# Patient Record
Sex: Female | Born: 1935 | Race: Black or African American | Hispanic: No | Marital: Single
Health system: Southern US, Community
[De-identification: ages and names within clinical notes are randomized; demographics above are authoritative.]

## PROBLEM LIST (undated history)

## (undated) DIAGNOSIS — S32000A Wedge compression fracture of unspecified lumbar vertebra, initial encounter for closed fracture: Secondary | ICD-10-CM

## (undated) DIAGNOSIS — C801 Malignant (primary) neoplasm, unspecified: Secondary | ICD-10-CM

## (undated) DIAGNOSIS — G939 Disorder of brain, unspecified: Secondary | ICD-10-CM

## (undated) DIAGNOSIS — E119 Type 2 diabetes mellitus without complications: Secondary | ICD-10-CM

## (undated) DIAGNOSIS — I10 Essential (primary) hypertension: Secondary | ICD-10-CM

## (undated) DIAGNOSIS — D649 Anemia, unspecified: Secondary | ICD-10-CM

---

## 2010-12-06 ENCOUNTER — Other Ambulatory Visit: Payer: Self-pay | Admitting: Oncology

## 2010-12-06 ENCOUNTER — Encounter (HOSPITAL_BASED_OUTPATIENT_CLINIC_OR_DEPARTMENT_OTHER): Payer: Medicare Other | Admitting: Oncology

## 2010-12-06 DIAGNOSIS — C499 Malignant neoplasm of connective and soft tissue, unspecified: Secondary | ICD-10-CM

## 2010-12-06 DIAGNOSIS — I10 Essential (primary) hypertension: Secondary | ICD-10-CM

## 2010-12-06 DIAGNOSIS — C549 Malignant neoplasm of corpus uteri, unspecified: Secondary | ICD-10-CM

## 2010-12-06 DIAGNOSIS — E119 Type 2 diabetes mellitus without complications: Secondary | ICD-10-CM

## 2010-12-06 LAB — COMPREHENSIVE METABOLIC PANEL
Albumin: 4.1 g/dL (ref 3.5–5.2)
Alkaline Phosphatase: 66 U/L (ref 39–117)
BUN: 25 mg/dL — ABNORMAL HIGH (ref 6–23)
Creatinine, Ser: 0.98 mg/dL (ref 0.50–1.10)
Glucose, Bld: 91 mg/dL (ref 70–99)
Potassium: 4.4 mEq/L (ref 3.5–5.3)

## 2010-12-06 LAB — CBC WITH DIFFERENTIAL/PLATELET
Basophils Absolute: 0 10*3/uL (ref 0.0–0.1)
Eosinophils Absolute: 0.3 10*3/uL (ref 0.0–0.5)
HCT: 37.8 % (ref 34.8–46.6)
HGB: 12.8 g/dL (ref 11.6–15.9)
LYMPH%: 39.9 % (ref 14.0–49.7)
MCH: 39.3 pg — ABNORMAL HIGH (ref 25.1–34.0)
MCV: 115.9 fL — ABNORMAL HIGH (ref 79.5–101.0)
MONO%: 8.2 % (ref 0.0–14.0)
NEUT#: 3.4 10*3/uL (ref 1.5–6.5)
NEUT%: 47.6 % (ref 38.4–76.8)
Platelets: 246 10*3/uL (ref 145–400)
RDW: 14.5 % (ref 11.2–14.5)

## 2010-12-14 ENCOUNTER — Other Ambulatory Visit (HOSPITAL_COMMUNITY): Payer: Medicare Other

## 2015-03-02 ENCOUNTER — Emergency Department (HOSPITAL_COMMUNITY): Payer: Medicare Other

## 2015-03-02 ENCOUNTER — Emergency Department (HOSPITAL_COMMUNITY)
Admission: EM | Admit: 2015-03-02 | Discharge: 2015-03-02 | Disposition: A | Discharge status: Home / Self Care | Payer: Medicare Other | Attending: Emergency Medicine | Admitting: Emergency Medicine

## 2015-03-02 ENCOUNTER — Encounter (HOSPITAL_COMMUNITY): Payer: Self-pay

## 2015-03-02 DIAGNOSIS — K5900 Constipation, unspecified: Secondary | ICD-10-CM | POA: Diagnosis not present

## 2015-03-02 DIAGNOSIS — Z8669 Personal history of other diseases of the nervous system and sense organs: Secondary | ICD-10-CM | POA: Insufficient documentation

## 2015-03-02 DIAGNOSIS — Z862 Personal history of diseases of the blood and blood-forming organs and certain disorders involving the immune mechanism: Secondary | ICD-10-CM | POA: Insufficient documentation

## 2015-03-02 DIAGNOSIS — I10 Essential (primary) hypertension: Secondary | ICD-10-CM | POA: Diagnosis not present

## 2015-03-02 DIAGNOSIS — Z79899 Other long term (current) drug therapy: Secondary | ICD-10-CM | POA: Insufficient documentation

## 2015-03-02 DIAGNOSIS — E119 Type 2 diabetes mellitus without complications: Secondary | ICD-10-CM | POA: Insufficient documentation

## 2015-03-02 DIAGNOSIS — Z8781 Personal history of (healed) traumatic fracture: Secondary | ICD-10-CM | POA: Insufficient documentation

## 2015-03-02 DIAGNOSIS — C499 Malignant neoplasm of connective and soft tissue, unspecified: Secondary | ICD-10-CM | POA: Insufficient documentation

## 2015-03-02 DIAGNOSIS — Z87440 Personal history of urinary (tract) infections: Secondary | ICD-10-CM | POA: Insufficient documentation

## 2015-03-02 DIAGNOSIS — C7800 Secondary malignant neoplasm of unspecified lung: Secondary | ICD-10-CM | POA: Insufficient documentation

## 2015-03-02 DIAGNOSIS — C480 Malignant neoplasm of retroperitoneum: Secondary | ICD-10-CM | POA: Insufficient documentation

## 2015-03-02 DIAGNOSIS — R197 Diarrhea, unspecified: Secondary | ICD-10-CM | POA: Diagnosis present

## 2015-03-02 HISTORY — DX: Malignant (primary) neoplasm, unspecified: C80.1

## 2015-03-02 HISTORY — DX: Anemia, unspecified: D64.9

## 2015-03-02 HISTORY — DX: Essential (primary) hypertension: I10

## 2015-03-02 HISTORY — DX: Disorder of brain, unspecified: G93.9

## 2015-03-02 HISTORY — DX: Type 2 diabetes mellitus without complications: E11.9

## 2015-03-02 HISTORY — DX: Wedge compression fracture of unspecified lumbar vertebra, initial encounter for closed fracture: S32.000A

## 2015-03-02 LAB — COMPREHENSIVE METABOLIC PANEL
ALT: 14 U/L (ref 14–54)
ANION GAP: 8 (ref 5–15)
AST: 24 U/L (ref 15–41)
Albumin: 2.9 g/dL — ABNORMAL LOW (ref 3.5–5.0)
Alkaline Phosphatase: 76 U/L (ref 38–126)
BILIRUBIN TOTAL: 1.1 mg/dL (ref 0.3–1.2)
BUN: 39 mg/dL — AB (ref 6–20)
CHLORIDE: 106 mmol/L (ref 101–111)
CO2: 26 mmol/L (ref 22–32)
Calcium: 9.5 mg/dL (ref 8.9–10.3)
Creatinine, Ser: 1.14 mg/dL — ABNORMAL HIGH (ref 0.44–1.00)
GFR calc Af Amer: 50 mL/min — ABNORMAL LOW (ref >60)
GFR, EST NON AFRICAN AMERICAN: 43 mL/min — AB (ref >60)
Glucose, Bld: 104 mg/dL — ABNORMAL HIGH (ref 65–99)
POTASSIUM: 3.7 mmol/L (ref 3.5–5.1)
Sodium: 140 mmol/L (ref 135–145)
TOTAL PROTEIN: 7.1 g/dL (ref 6.5–8.1)

## 2015-03-02 LAB — CBC WITH DIFFERENTIAL/PLATELET
Basophils Absolute: 0.1 10*3/uL (ref 0.0–0.1)
Basophils Relative: 1 %
EOS PCT: 2 %
Eosinophils Absolute: 0.1 10*3/uL (ref 0.0–0.7)
HCT: 33.1 % — ABNORMAL LOW (ref 36.0–46.0)
HEMOGLOBIN: 11.1 g/dL — AB (ref 12.0–15.0)
LYMPHS ABS: 1.4 10*3/uL (ref 0.7–4.0)
LYMPHS PCT: 25 %
MCH: 35.9 pg — AB (ref 26.0–34.0)
MCHC: 33.5 g/dL (ref 30.0–36.0)
MCV: 107.1 fL — AB (ref 78.0–100.0)
MONOS PCT: 22 %
Monocytes Absolute: 1.2 10*3/uL — ABNORMAL HIGH (ref 0.1–1.0)
NEUTROS PCT: 50 %
Neutro Abs: 2.9 10*3/uL (ref 1.7–7.7)
Platelets: 351 10*3/uL (ref 150–400)
RBC: 3.09 MIL/uL — AB (ref 3.87–5.11)
RDW: 18.2 % — ABNORMAL HIGH (ref 11.5–15.5)
WBC: 5.7 10*3/uL (ref 4.0–10.5)

## 2015-03-02 LAB — I-STAT CHEM 8, ED
BUN: 36 mg/dL — AB (ref 6–20)
CALCIUM ION: 1.25 mmol/L (ref 1.13–1.30)
CHLORIDE: 104 mmol/L (ref 101–111)
Creatinine, Ser: 1 mg/dL (ref 0.44–1.00)
GLUCOSE: 99 mg/dL (ref 65–99)
HCT: 29 % — ABNORMAL LOW (ref 36.0–46.0)
Hemoglobin: 9.9 g/dL — ABNORMAL LOW (ref 12.0–15.0)
Potassium: 3.6 mmol/L (ref 3.5–5.1)
Sodium: 141 mmol/L (ref 135–145)
TCO2: 27 mmol/L (ref 0–100)

## 2015-03-02 MED ORDER — ALIGN PO CAPS: 1.0000 | ORAL_CAPSULE | Freq: Three times a day (TID) | ORAL | Status: AC

## 2015-03-02 MED ORDER — HEPARIN SOD (PORK) LOCK FLUSH 100 UNIT/ML IV SOLN
500.0000 [IU] | Freq: Once | INTRAVENOUS | Status: AC
  Administered 2015-03-02: 500 [IU]
  Filled 2015-03-02: qty 5

## 2015-03-02 MED ORDER — SENNOSIDES-DOCUSATE SODIUM 8.6-50 MG PO TABS: 1.0000 | ORAL_TABLET | Freq: Two times a day (BID) | ORAL | Status: AC

## 2015-03-02 MED ORDER — DOCUSATE SODIUM 100 MG PO CAPS
100.0000 mg | ORAL_CAPSULE | Freq: Once | ORAL | Status: AC
  Administered 2015-03-02: 100 mg via ORAL
  Filled 2015-03-02: qty 1

## 2015-03-02 MED ORDER — GLYCERIN (LAXATIVE) 2.1 G RE SUPP
1.0000 | Freq: Once | RECTAL | Status: AC
  Administered 2015-03-02: 1 via RECTAL
  Filled 2015-03-02: qty 1

## 2015-03-02 MED ORDER — POLYETHYLENE GLYCOL 3350 17 GM/SCOOP PO POWD: 17.0000 g | Freq: Once | ORAL | Status: AC

## 2015-03-02 NOTE — ED Notes (Signed)
Bed: WA25 Expected date:  Expected time:  Means of arrival:  Comments: 

## 2015-03-02 NOTE — ED Notes (Signed)
Pt complaining of diarrhea. Denies N/V/F. Pt has had diarrhea 2x so far this morning. Pt also reports decreased appetite. Last treatment was Oct 4. A and O x 3. Daughter reports that is her baseline. Denies pain.

## 2015-03-02 NOTE — Discharge Instructions (Signed)

## 2015-03-02 NOTE — ED Notes (Signed)
Pt requests for only daughter to assist with putting on briefs.

## 2015-03-02 NOTE — ED Provider Notes (Signed)
CSN: [id number]     Arrival date & time 03/02/15  0132 History  By signing my name below, I, Angela Rodriguez, attest that this documentation has been prepared under the direction and in the presence of Angela Zwiefelhofer, MD. Electronically Signed: Helane Rodriguez, ED Scribe. 03/02/2015. 3:49 AM.    Chief Complaint  Patient presents with  . Diarrhea  . Cancer   Patient is a 81 y.o. female presenting with diarrhea. The history is provided by the patient and a relative. No language interpreter was used.  Diarrhea Quality:  Watery (following dulcolax) Severity:  Moderate Onset quality:  Sudden Duration:  2 days Timing:  Sporadic Progression:  Worsening Relieved by:  Nothing Worsened by:  Nothing tried Ineffective treatments:  None tried Associated symptoms: no abdominal pain, no fever and no vomiting   Risk factors: no recent antibiotic use and no sick contacts    HPI Comments: Angela Rodriguez is a 81 y.o. female with a PMHx of stage 4 cancer, DM, HTN, anemia, brain disorder and a compression fracture of the lumbar vertebra who presents to the Emergency Department complaining of worsening cancer symptoms and diarrhea (2x) onset 2 days ago. Daughter states pt took an OTC dulcolax 3 days ago for constipation. She states pt had several BM's every few hours after taking the medication, which grew progressively more watery. She also reports pt has not been eating and drinking well, and may be dehydrated. She notes pt's last cancer treatment was on 10/4 at [facility]. Per daughter pt is not appropriate for chemotherapy at this time due to lost weight and loss of appetite. She notes pt was on chemo pills last year, then went to  IV infusions this year. She notes pt was amditted at [facility] for confusion on 11/1, where she was diagnosed with a UTI and given oral antibiotics to be taken 3x daily (last dose taken on 11/10).  Daughter denies pt having any recent sick contacts. Pt denies n/v, and fever.   Pt is  allergic to tramadol.  Past Medical History  Diagnosis Date  . Cancer (HCC)     leiomysarcoma, retroperitoneal sarcoma,secondary cancer of lung, cancer of connective and soft tissue  . Diabetes mellitus without complication ([place])     type 2  . Compression fracture of lumbar vertebra ([place])   . Hypertension   . Anemia   . Brain disorder    No past surgical history on file. No family history on file. Social History  Substance Use Topics  . Smoking status: None  . Smokeless tobacco: None  . Alcohol Use: None   OB History    No data available     Review of Systems  Constitutional: Negative for fever.  Gastrointestinal: Positive for diarrhea. Negative for nausea, vomiting and abdominal pain.  All other systems reviewed and are negative.   Allergies  Tramadol  Home Medications   Prior to Admission medications   Medication Sig Start Date End Date Taking? Authorizing Provider  amLODipine (NORVASC) 5 MG tablet Take 5 mg by mouth daily.   Yes Historical Provider, MD  ascorbic acid (VITAMIN C) 500 MG tablet Take 500 mg by mouth daily.   Yes Historical Provider, MD  atenolol (TENORMIN) 50 MG tablet Take 50 mg by mouth daily. 12/05/14  Yes Historical Provider, MD  atorvastatin (LIPITOR) 10 MG tablet Take 10 mg by mouth daily. 05/15/12  Yes Historical Provider, MD  Calcium Carbonate-Vitamin D 600-400 MG-UNIT tablet Take 1 tablet by mouth daily.  Yes Historical Provider, MD  docusate sodium (COLACE) 100 MG capsule Take 100 mg by mouth daily as needed. For constipation   Yes Historical Provider, MD  glipiZIDE (GLUCOTROL XL) 2.5 MG 24 hr tablet Take 2.5 mg by mouth daily as needed. For high blood sugar   Yes Historical Provider, MD  hydrochlorothiazide (HYDRODIURIL) 25 MG tablet Take 25 mg by mouth daily.   Yes Historical Provider, MD  lisinopril (PRINIVIL,ZESTRIL) 10 MG tablet Take 10 mg by mouth daily. 07/08/14 07/08/15 Yes Historical Provider, MD  magic mouthwash SOLN Take 10 mLs by  mouth 4 (four) times daily as needed. For oral pain 06/23/14  Yes Historical Provider, MD  Multiple Vitamin (MULTIVITAMIN WITH MINERALS) TABS tablet Take 1 tablet by mouth daily.   Yes Historical Provider, MD  Omega-3 Fatty Acids (FISH OIL) 1000 MG CAPS Take 1,000 mg by mouth daily.   Yes Historical Provider, MD  potassium chloride (K-DUR) 10 MEQ tablet Take 10 mEq by mouth daily. 05/29/14  Yes Historical Provider, MD  prochlorperazine (COMPAZINE) 10 MG tablet Take 10 mg by mouth every 6 (six) hours as needed. For nausea associated with chemo 04/15/14  Yes Historical Provider, MD  mirtazapine (REMERON SOL-TAB) 15 MG disintegrating tablet Take 15 mg by mouth at bedtime as needed (for sleep).  02/25/15   Historical Provider, MD   BP 136/81 mmHg  Pulse 91  Temp(Src) 97.9 F (36.6 C) (Oral)  Resp 18  SpO2 95% Physical Exam  Constitutional: She is oriented to person, place, and time. She appears well-developed and well-nourished.  HENT:  Head: Normocephalic and atraumatic.  Mouth/Throat: Oropharynx is clear and moist.  Eyes: Conjunctivae and EOM are normal. Pupils are equal, round, and reactive to light. Right eye exhibits no discharge. Left eye exhibits no discharge. No scleral icterus.  Neck: Normal range of motion. Neck supple.  Cardiovascular: Normal rate, regular rhythm and normal heart sounds.   Pulmonary/Chest: Effort normal and breath sounds normal. No respiratory distress. She has no wheezes. She has no rales.  Abdominal: Soft. Bowel sounds are normal. She exhibits no distension. There is no tenderness. There is no rebound and no guarding.  Stool palpated  Musculoskeletal: Normal range of motion. She exhibits no edema or tenderness.  Neurological: She is alert and oriented to person, place, and time. She has normal reflexes. Coordination normal.  Skin: Skin is warm and dry. No rash noted. She is not diaphoretic. No erythema.  Psychiatric: She has a normal mood and affect.  Nursing note and  vitals reviewed.   ED Course  Procedures  DIAGNOSTIC STUDIES: Oxygen Saturation is 95% on RA, adequate by my interpretation.    COORDINATION OF CARE: 3:33 AM - Discussed XR results of constipation. Discussed plans to wait on diagnostic studies. Pt advised of plan for treatment and pt agrees.  Labs Review Labs Reviewed  CBC WITH DIFFERENTIAL/PLATELET - Abnormal; Notable for the following:    RBC 3.09 (*)    Hemoglobin 11.1 (*)    HCT 33.1 (*)    MCV 107.1 (*)    MCH 35.9 (*)    RDW 18.2 (*)    Monocytes Absolute 1.2 (*)    All other components within normal limits  I-STAT CHEM 8, ED - Abnormal; Notable for the following:    BUN 36 (*)    Hemoglobin 9.9 (*)    HCT 29.0 (*)    All other components within normal limits  COMPREHENSIVE METABOLIC PANEL  URINALYSIS, ROUTINE W REFLEX MICROSCOPIC (NOT AT  [place])    Imaging Review Dg Abd Acute W/chest  03/02/2015  CLINICAL DATA:  Acute onset of diarrhea for 3 days. Initial encounter. EXAM: DG ABDOMEN ACUTE W/ 1V CHEST COMPARISON:  None. FINDINGS: The lungs are well-aerated. There appears to be a 2.4 cm nodule at the right midlung zone. No pleural effusion or pneumothorax is seen. A 2.3 cm pleural-based nodule is suggested at the left lung base. The cardiomediastinal silhouette is normal in size. A large calcified node is noted above the left hilum. A calcified granuloma is noted at the left midlung zone. A left-sided chest port is noted ending about the mid SVC. The visualized bowel gas pattern is unremarkable. Scattered stool and air are seen within the colon; there is no evidence of small bowel dilatation to suggest obstruction. No free intra-abdominal air is identified on the provided upright view. Clips are noted within the right upper quadrant, reflecting prior cholecystectomy. No acute osseous abnormalities are seen; the sacroiliac joints are unremarkable in appearance. Degenerative change is noted along the lumbar spine, with apparent  chronic loss of height at vertebral body L1. IMPRESSION: 1. Apparent 2.4 cm nodule at the right midlung zone, and 2.3 cm pleural-based nodule at the left lung base. CT of the chest would be helpful for further evaluation, when and as deemed clinically appropriate. 2. Unremarkable bowel gas pattern; no free intra-abdominal air seen. Moderate amount of stool noted in the colon. Electronically Signed   By: Garald Balding M.D.   On: 03/02/2015 02:44   I have personally reviewed and evaluated these images and lab results as part of my medical decision-making.   EKG Interpretation None      MDM   Final diagnoses:  None    Small stool in the ED.  Patient has liquid stool around hard stool with constipation on XR.  Will start bowel regimen.  Follow up with your PMD tomorrow.  Strict return precautions given I personally performed the services described in this documentation, which was scribed in my presence. The recorded information has been reviewed and is accurate.     Veatrice Kells, MD 03/02/15 [phone]

## 2015-10-20 DEATH — deceased

## 2017-05-25 IMAGING — CR DG ABDOMEN ACUTE W/ 1V CHEST
3 series · 3 of 3 positions shown · non-contrast
Comparison: None.

CLINICAL DATA: Acute onset of diarrhea for 3 days. Initial
encounter.

EXAM:
DG ABDOMEN ACUTE W/ 1V CHEST

[w chest pa]
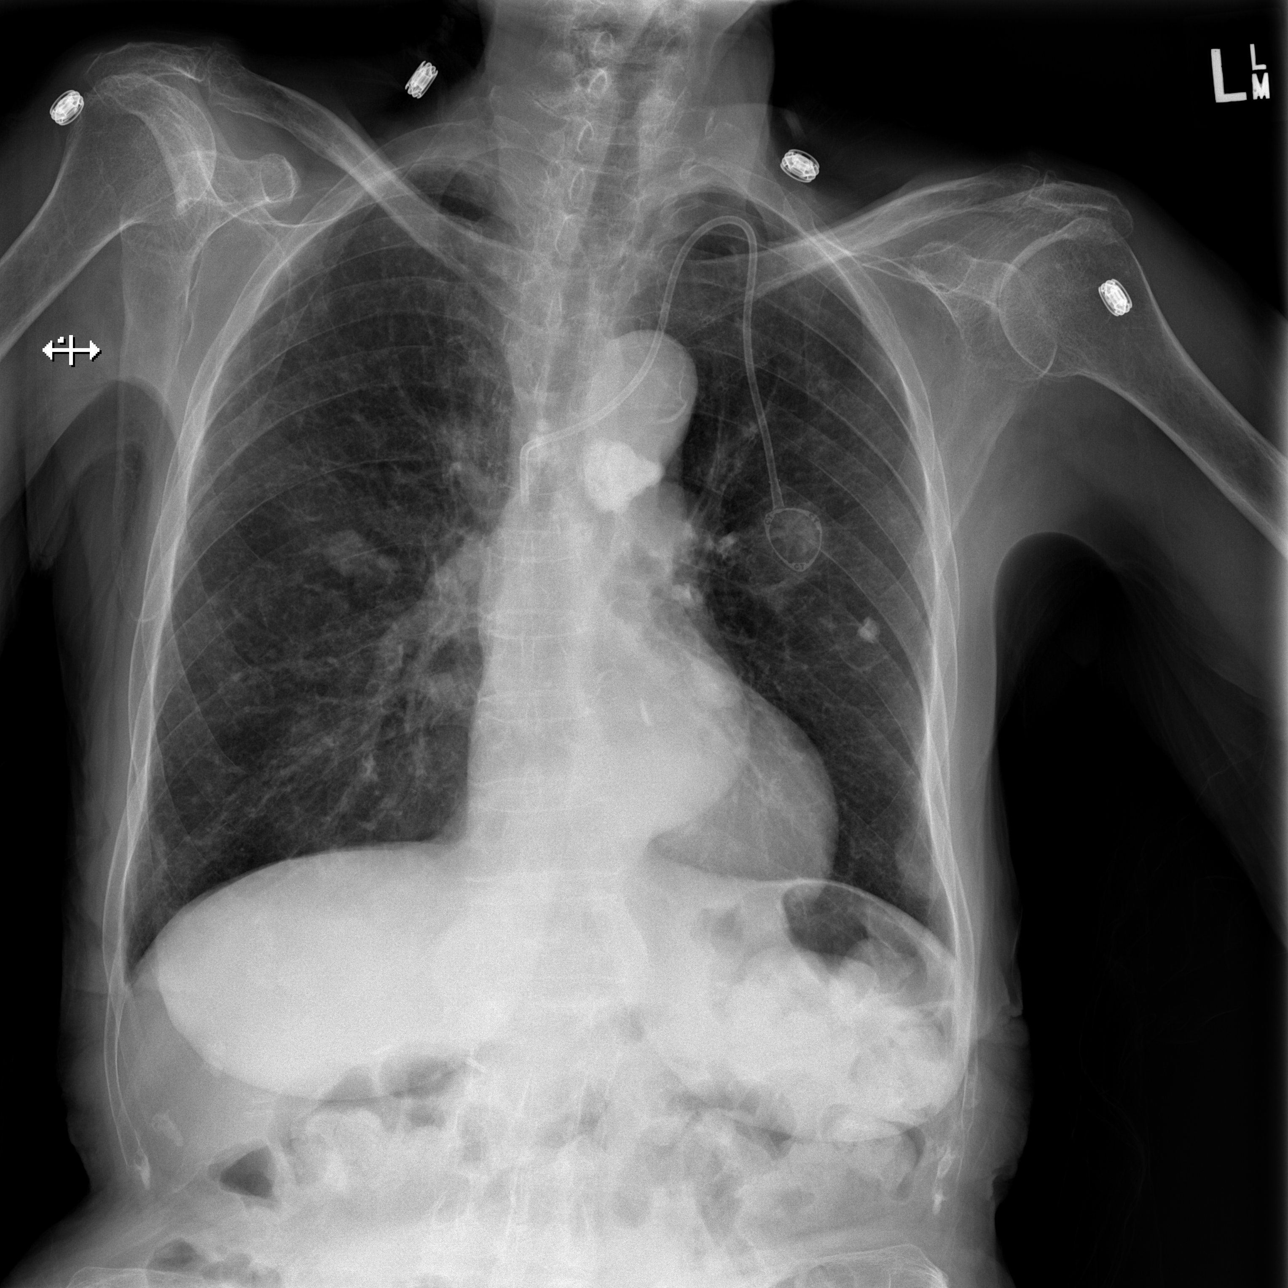

[w abdomen upright]
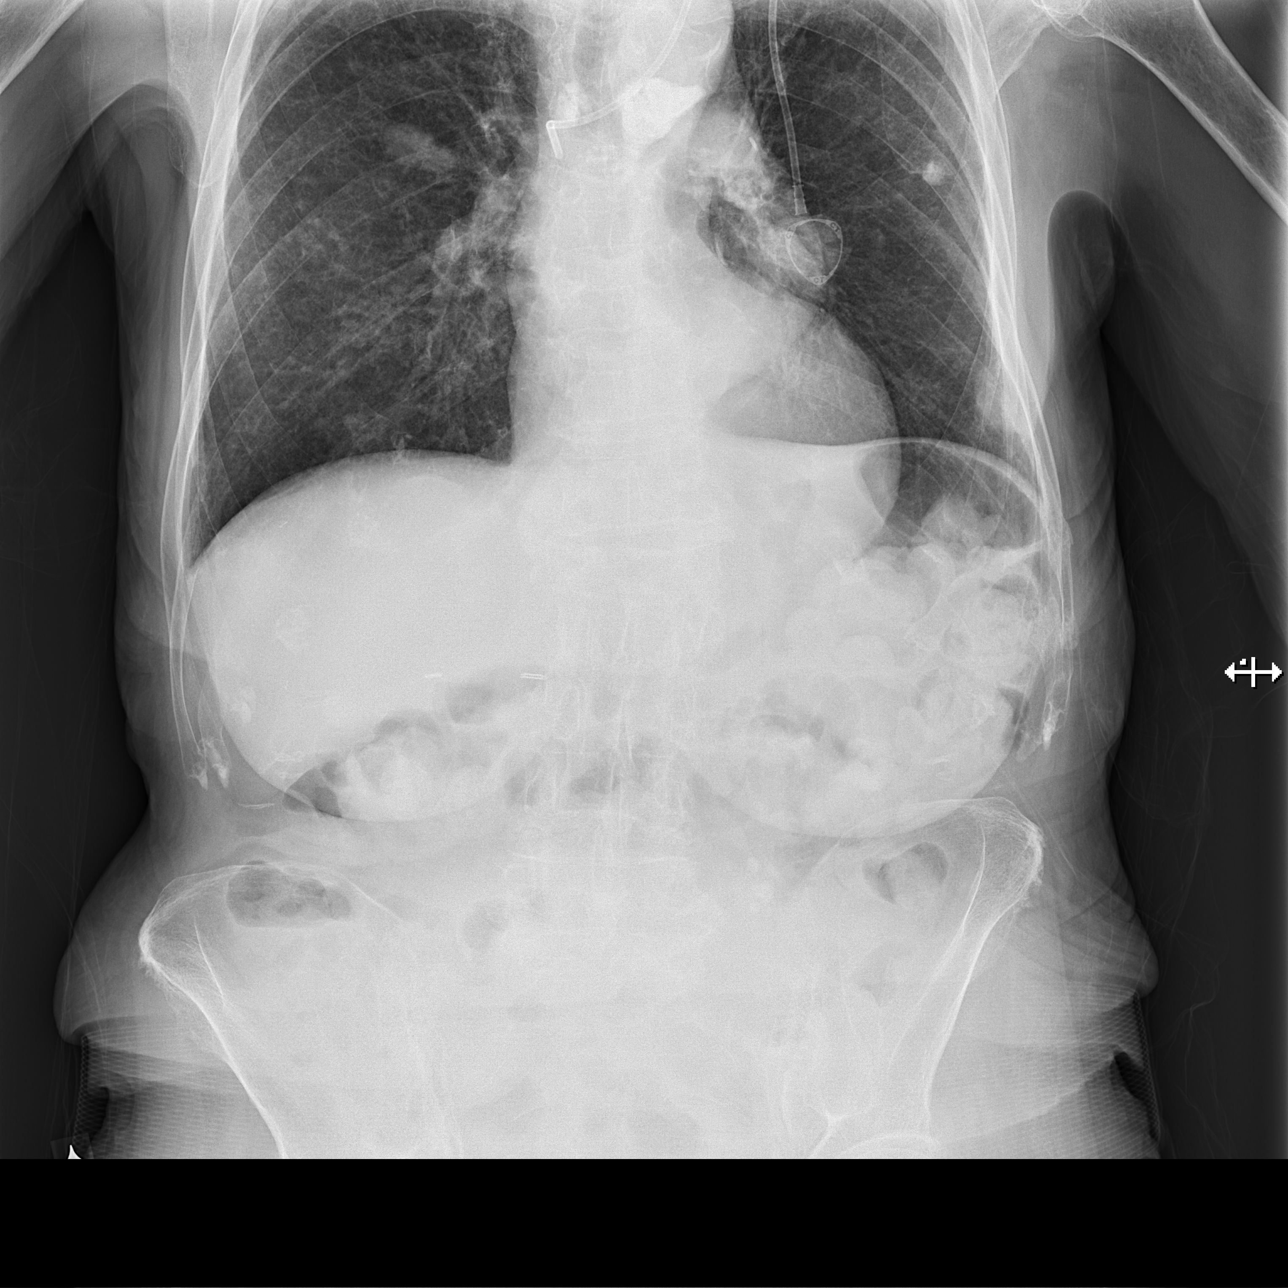

[t abdomen supine]
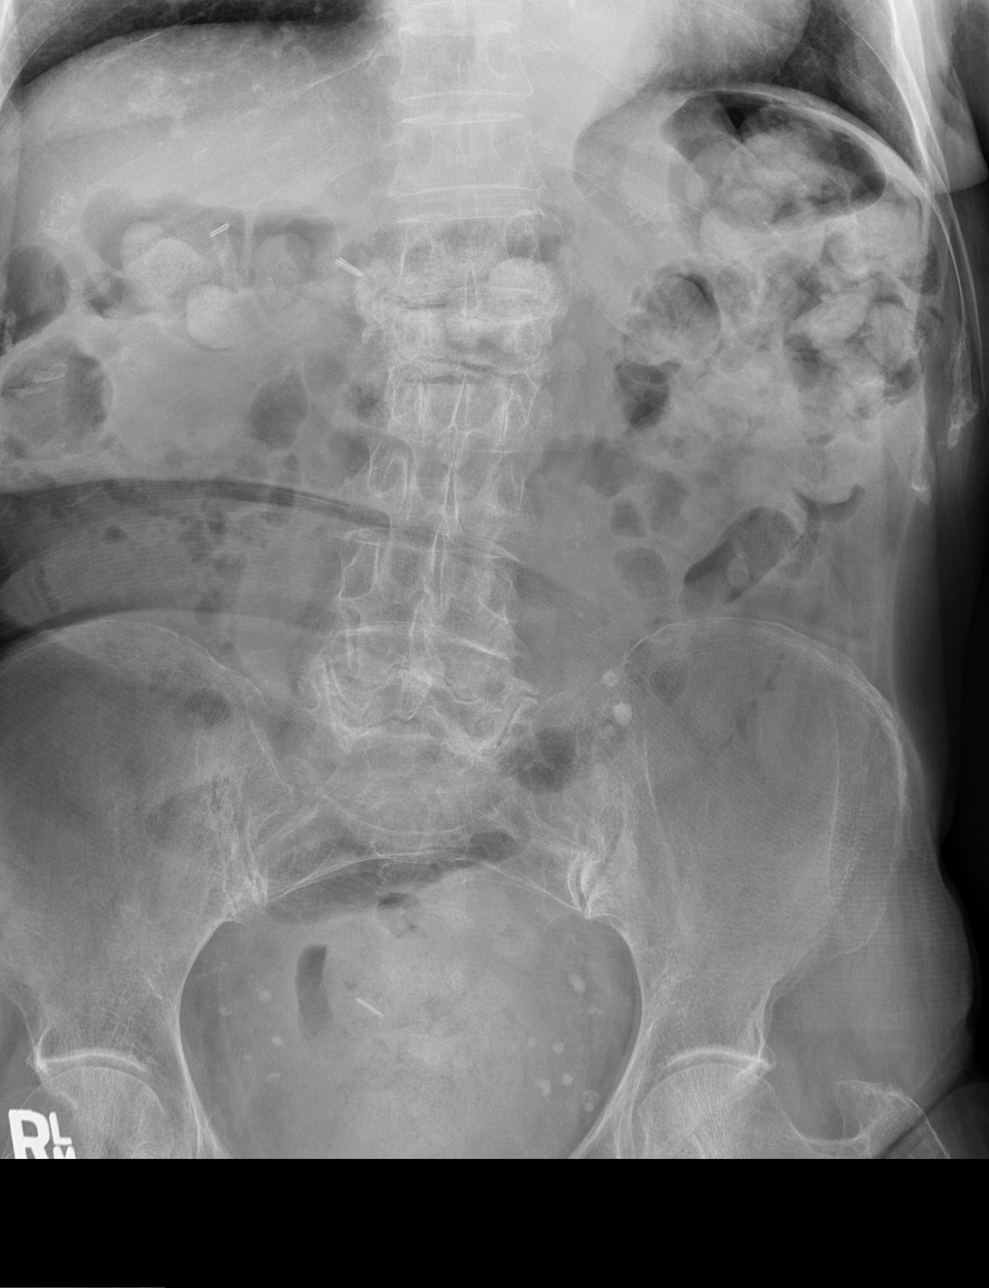

[3 of 3 positions shown; findings below may reference images not displayed]

FINDINGS: The lungs are well-aerated. There appears to be a 2.4 cm nodule at
the right midlung zone. No pleural effusion or pneumothorax is seen.
A 2.3 cm pleural-based nodule is suggested at the left lung base.
The cardiomediastinal silhouette is normal in size. A large
calcified node is noted above the left hilum. A calcified granuloma
is noted at the left midlung zone. A left-sided chest port is noted
ending about the mid SVC.

The visualized bowel gas pattern is unremarkable. Scattered stool
and air are seen within the colon; there is no evidence of small
bowel dilatation to suggest obstruction. No free intra-abdominal air
is identified on the provided upright view. Clips are noted within
the right upper quadrant, reflecting prior cholecystectomy.

No acute osseous abnormalities are seen; the sacroiliac joints are
unremarkable in appearance. Degenerative change is noted along the
lumbar spine, with apparent chronic loss of height at vertebral body
L1.
IMPRESSION: 1. Apparent 2.4 cm nodule at the right midlung zone, and 2.3 cm
pleural-based nodule at the left lung base. CT of the chest would be
helpful for further evaluation, when and as deemed clinically
appropriate.
2. Unremarkable bowel gas pattern; no free intra-abdominal air seen.
Moderate amount of stool noted in the colon.
